# Patient Record
Sex: Male | Born: 1960 | Race: White | Hispanic: No | Marital: Single | Smoking: Current every day smoker
Health system: Southern US, Community
[De-identification: ages and names within clinical notes are randomized; demographics above are authoritative.]

---

## 1997-09-07 ENCOUNTER — Emergency Department (HOSPITAL_COMMUNITY): Admission: EM | Admit: 1997-09-07 | Discharge: 1997-09-08 | Payer: Self-pay

## 1998-03-26 ENCOUNTER — Encounter: Payer: Self-pay | Admitting: Emergency Medicine

## 1998-03-26 ENCOUNTER — Emergency Department (HOSPITAL_COMMUNITY): Admission: EM | Admit: 1998-03-26 | Discharge: 1998-03-26 | Payer: Self-pay | Admitting: Emergency Medicine

## 1999-02-28 ENCOUNTER — Emergency Department (HOSPITAL_COMMUNITY): Admission: EM | Admit: 1999-02-28 | Discharge: 1999-02-28 | Payer: Self-pay | Admitting: Internal Medicine

## 1999-06-25 ENCOUNTER — Emergency Department (HOSPITAL_COMMUNITY): Admission: EM | Admit: 1999-06-25 | Discharge: 1999-06-25 | Payer: Self-pay | Admitting: Emergency Medicine

## 2000-01-03 ENCOUNTER — Emergency Department (HOSPITAL_COMMUNITY): Admission: EM | Admit: 2000-01-03 | Discharge: 2000-01-03 | Payer: Self-pay | Admitting: Emergency Medicine

## 2000-03-03 ENCOUNTER — Emergency Department (HOSPITAL_COMMUNITY): Admission: EM | Admit: 2000-03-03 | Discharge: 2000-03-03 | Payer: Self-pay | Admitting: Emergency Medicine

## 2000-03-08 ENCOUNTER — Emergency Department (HOSPITAL_COMMUNITY): Admission: EM | Admit: 2000-03-08 | Discharge: 2000-03-08 | Payer: Self-pay | Admitting: Emergency Medicine

## 2000-03-14 ENCOUNTER — Emergency Department (HOSPITAL_COMMUNITY): Admission: EM | Admit: 2000-03-14 | Discharge: 2000-03-15 | Payer: Self-pay | Admitting: Emergency Medicine

## 2000-07-11 ENCOUNTER — Encounter: Payer: Self-pay | Admitting: Emergency Medicine

## 2000-07-11 ENCOUNTER — Emergency Department (HOSPITAL_COMMUNITY): Admission: EM | Admit: 2000-07-11 | Discharge: 2000-07-11 | Payer: Self-pay | Admitting: Emergency Medicine

## 2000-07-12 ENCOUNTER — Emergency Department (HOSPITAL_COMMUNITY): Admission: EM | Admit: 2000-07-12 | Discharge: 2000-07-12 | Payer: Self-pay | Admitting: Emergency Medicine

## 2002-12-05 ENCOUNTER — Emergency Department (HOSPITAL_COMMUNITY): Admission: EM | Admit: 2002-12-05 | Discharge: 2002-12-05 | Payer: Self-pay | Admitting: Emergency Medicine

## 2003-01-19 ENCOUNTER — Emergency Department (HOSPITAL_COMMUNITY): Admission: EM | Admit: 2003-01-19 | Discharge: 2003-01-19 | Payer: Self-pay | Admitting: Emergency Medicine

## 2004-02-03 ENCOUNTER — Emergency Department (HOSPITAL_COMMUNITY): Admission: EM | Admit: 2004-02-03 | Discharge: 2004-02-03 | Payer: Self-pay | Admitting: Emergency Medicine

## 2004-05-15 ENCOUNTER — Emergency Department (HOSPITAL_COMMUNITY): Admission: EM | Admit: 2004-05-15 | Discharge: 2004-05-16 | Payer: Self-pay | Admitting: Emergency Medicine

## 2010-08-08 ENCOUNTER — Other Ambulatory Visit: Payer: Self-pay | Admitting: Family Medicine

## 2010-08-08 DIAGNOSIS — R110 Nausea: Secondary | ICD-10-CM

## 2010-08-09 ENCOUNTER — Ambulatory Visit
Admission: RE | Admit: 2010-08-09 | Discharge: 2010-08-09 | Disposition: A | Discharge status: Home / Self Care | Payer: No Typology Code available for payment source | Source: Ambulatory Visit | Attending: Family Medicine | Admitting: Family Medicine

## 2010-08-09 DIAGNOSIS — R110 Nausea: Secondary | ICD-10-CM

## 2010-08-15 ENCOUNTER — Encounter: Payer: Self-pay | Admitting: Internal Medicine

## 2010-09-06 ENCOUNTER — Ambulatory Visit: Payer: No Typology Code available for payment source | Admitting: Internal Medicine

## 2014-08-03 ENCOUNTER — Ambulatory Visit (INDEPENDENT_AMBULATORY_CARE_PROVIDER_SITE_OTHER): Payer: 59

## 2014-08-03 ENCOUNTER — Ambulatory Visit (INDEPENDENT_AMBULATORY_CARE_PROVIDER_SITE_OTHER): Payer: 59 | Admitting: Family Medicine

## 2014-08-03 VITALS — BP 140/90 | HR 51 | Temp 98.2°F | Resp 18 | Ht 67.5 in | Wt 164.2 lb

## 2014-08-03 DIAGNOSIS — F17200 Nicotine dependence, unspecified, uncomplicated: Secondary | ICD-10-CM

## 2014-08-03 DIAGNOSIS — M5442 Lumbago with sciatica, left side: Secondary | ICD-10-CM

## 2014-08-03 DIAGNOSIS — R062 Wheezing: Secondary | ICD-10-CM | POA: Diagnosis not present

## 2014-08-03 DIAGNOSIS — J209 Acute bronchitis, unspecified: Secondary | ICD-10-CM

## 2014-08-03 MED ORDER — ALBUTEROL SULFATE HFA 108 (90 BASE) MCG/ACT IN AERS: 2.0000 | INHALATION_SPRAY | Freq: Four times a day (QID) | RESPIRATORY_TRACT | Status: DC | PRN

## 2014-08-03 MED ORDER — IPRATROPIUM BROMIDE 0.02 % IN SOLN
0.5000 mg | Freq: Once | RESPIRATORY_TRACT | Status: AC
  Administered 2014-08-03: 0.5 mg via RESPIRATORY_TRACT

## 2014-08-03 MED ORDER — TRAMADOL HCL 50 MG PO TABS: 50.0000 mg | ORAL_TABLET | Freq: Three times a day (TID) | ORAL | Status: AC | PRN

## 2014-08-03 MED ORDER — ALBUTEROL SULFATE (2.5 MG/3ML) 0.083% IN NEBU
2.5000 mg | INHALATION_SOLUTION | Freq: Once | RESPIRATORY_TRACT | Status: AC
  Administered 2014-08-03: 2.5 mg via RESPIRATORY_TRACT

## 2014-08-03 MED ORDER — CYCLOBENZAPRINE HCL 5 MG PO TABS: 5.0000 mg | ORAL_TABLET | Freq: Every evening | ORAL | Status: AC | PRN

## 2014-08-03 MED ORDER — NAPROXEN 500 MG PO TABS: 500.0000 mg | ORAL_TABLET | Freq: Two times a day (BID) | ORAL | Status: AC

## 2014-08-03 MED ORDER — METHYLPREDNISOLONE 4 MG PO TBPK: ORAL_TABLET | ORAL | Status: DC

## 2014-08-03 MED ORDER — AZITHROMYCIN 250 MG PO TABS: ORAL_TABLET | ORAL | Status: DC

## 2014-08-03 NOTE — Progress Notes (Signed)
 Chief Complaint:  Chief Complaint  Patient presents with  . Back Pain    lower back, x 1 week  . Insect Bite    both legs x 1 week unsure of what bit him    HPI: Dakota Steele is a 54 y.o. male who [facility] today complaining of one-week history of low back pain on the left side status post helping a friend move. He is visiting from [place]. He has some left tingling. It is mostly on the front of his leg. It does not radiate down to his passes knee. He has no incontinence. He has a prior history of low back pain and had seen a neurosurgeon in [place]. They were asked him if he wanted to surgery but he said no because there was a small chance that he would not do well. He denies any diabetes. He is a smoker and has been wheezing off and on when he lays down. No weight gain no lower extremity edema. No palpitations or chest pain. The 25 year one pack per day smoker. He is not interested in quitting. Coughing up white sputum. He has had some intermittent subjective fevers.  History reviewed. No pertinent past medical history. History reviewed. No pertinent past surgical history. History   Social History  . Marital Status: Single    Spouse Name: N/A  . Number of Children: N/A  . Years of Education: N/A   Social History Main Topics  . Smoking status: Current Every Day Smoker  . Smokeless tobacco: Not on file  . Alcohol Use: No  . Drug Use: Not on file  . Sexual Activity: Not on file   Other Topics Concern  . None   Social History Narrative  . None   Family History  Problem Relation Age of Onset  . Hypertension Mother    No Known Allergies Prior to Admission medications   Not on File     ROS: The patient denies fevers, chills, night sweats, unintentional weight loss, chest pain, palpitations, wheezing, dyspnea on exertion, nausea, vomiting, abdominal pain, dysuria, hematuria, melena, numbness, weakness, or tingling.   All other systems have been reviewed  and were otherwise negative with the exception of those mentioned in the HPI and as above.    PHYSICAL EXAM: Filed Vitals:   08/03/14 1215  BP: 140/90  Pulse: 51  Temp: 98.2 F (36.8 C)  Resp: 18   SpO2 Readings from Last 3 Encounters:  08/03/14 98%     General: Alert, no acute distress HEENT:  Normocephalic, atraumatic, oropharynx patent. TM nl Eye: EOMI, PERRLA Cardiovascular:  Regular rate and rhythm, no rubs murmurs or gallops.  No Carotid bruits, radial pulse intact. No pedal edema.  Respiratory: + wheezes, No rales, or rhonchi.  No cyanosis, no use of accessory musculature Abdominal: No organomegaly, abdomen is soft and non-tender, positive bowel sounds.  No masses. Musculoskeletal: Gait intact. No edema, tenderness Skin: + bug bites no cellulitis Neurologic: Facial musculature symmetric. Psychiatric: Patient acts appropriately throughout our interaction. Lymphatic: No cervical or submandibular lymphadenopathy + paramsk tenderness  Full ROM 5/5 strength, 2/2 DTRs No saddle anesthesia Straight leg negative Hip and knee exam--normal       LABS: No results found for this or any previous visit.   EKG/XRAY:   Primary read interpreted [facility] Chest xray -? Left mid lobe perihilar infiltrate vs increase vascular markings L-spine-no acute fx or dislocation, + DJD , spondylisthesis Vessel  calcification   ASSESSMENT/PLAN: DJD with sciatica Acute lower respiratory infection most likely bronchitis Rx z pak, prednisone, albuterol; flexeril, tramadol, naprosyn once done with prednisone Pre and post peak were slightly improved after neb treatment x 1, he declines to stop smoking Needs fu with PCP in [place]      Gross sideeffects, risk and benefits, and alternatives of medications d/w patient. Patient is aware that all medications have potential sideeffects and we are unable to predict every sideeffect or drug-drug interaction that may occur.     DO 08/03/2014 1:24 PM

## 2014-08-03 NOTE — Patient Instructions (Signed)
Sciatica with Rehab The sciatic nerve runs from the back down the leg and is responsible for sensation and control of the muscles in the back (posterior) side of the thigh, lower leg, and foot. Sciatica is a condition that is characterized by inflammation of this nerve.  SYMPTOMS   Signs of nerve damage, including numbness and/or weakness along the posterior side of the lower extremity.  Pain in the back of the thigh that may also travel down the leg.  Pain that worsens when sitting for long periods of time.  Occasionally, pain in the back or buttock. CAUSES  Inflammation of the sciatic nerve is the cause of sciatica. The inflammation is due to something irritating the nerve. Common sources of irritation include:  Sitting for long periods of time.  Direct trauma to the nerve.  Arthritis of the spine.  Herniated or ruptured disk.  Slipping of the vertebrae (spondylolisthesis).  Pressure from soft tissues, such as muscles or ligament-like tissue (fascia). RISK INCREASES WITH:  Sports that place pressure or stress on the spine (football or weightlifting).  Poor strength and flexibility.  Failure to warm up properly before activity.  Family history of low back pain or disk disorders.  Previous back injury or surgery.  Poor body mechanics, especially when lifting, or poor posture. PREVENTION   Warm up and stretch properly before activity.  Maintain physical fitness:  Strength, flexibility, and endurance.  Cardiovascular fitness.  Learn and use proper technique, especially with posture and lifting. When possible, have coach correct improper technique.  Avoid activities that place stress on the spine. PROGNOSIS If treated properly, then sciatica usually resolves within 6 weeks. However, occasionally surgery is necessary.  RELATED COMPLICATIONS   Permanent nerve damage, including pain, numbness, tingle, or weakness.  Chronic back pain.  Risks of surgery: infection,  bleeding, nerve damage, or damage to surrounding tissues. TREATMENT Treatment initially involves resting from any activities that aggravate your symptoms. The use of ice and medication may help reduce pain and inflammation. The use of strengthening and stretching exercises may help reduce pain with activity. These exercises may be performed at home or with referral to a therapist. A therapist may recommend further treatments, such as transcutaneous electronic nerve stimulation (TENS) or ultrasound. Your caregiver may recommend corticosteroid injections to help reduce inflammation of the sciatic nerve. If symptoms persist despite non-surgical (conservative) treatment, then surgery may be recommended. MEDICATION  If pain medication is necessary, then nonsteroidal anti-inflammatory medications, such as aspirin and ibuprofen, or other minor pain relievers, such as acetaminophen, are often recommended.  Do not take pain medication for 7 days before surgery.  Prescription pain relievers may be given if deemed necessary by your caregiver. Use only as directed and only as much as you need.  Ointments applied to the skin may be helpful.  Corticosteroid injections may be given by your caregiver. These injections should be reserved for the most serious cases, because they may only be given a certain number of times. HEAT AND COLD  Cold treatment (icing) relieves pain and reduces inflammation. Cold treatment should be applied for 10 to 15 minutes every 2 to 3 hours for inflammation and pain and immediately after any activity that aggravates your symptoms. Use ice packs or massage the area with a piece of ice (ice massage).  Heat treatment may be used prior to performing the stretching and strengthening activities prescribed by your caregiver, physical therapist, or athletic trainer. Use a heat pack or soak the injury in warm water.   SEEK MEDICAL CARE IF:  Treatment seems to offer no benefit, or the condition  worsens.  Any medications produce adverse side effects. EXERCISES  RANGE OF MOTION (ROM) AND STRETCHING EXERCISES - Sciatica Most people with sciatic will find that their symptoms worsen with either excessive bending forward (flexion) or arching at the low back (extension). The exercises which will help resolve your symptoms will focus on the opposite motion. Your physician, physical therapist or athletic trainer will help you determine which exercises will be most helpful to resolve your low back pain. Do not complete any exercises without first consulting with your clinician. Discontinue any exercises which worsen your symptoms until you speak to your clinician. If you have pain, numbness or tingling which travels down into your buttocks, leg or foot, the goal of the therapy is for these symptoms to move closer to your back and eventually resolve. Occasionally, these leg symptoms will get better, but your low back pain may worsen; this is typically an indication of progress in your rehabilitation. Be certain to be very alert to any changes in your symptoms and the activities in which you participated in the 24 hours prior to the change. Sharing this information with your clinician will allow him/her to most efficiently treat your condition. These exercises may help you when beginning to rehabilitate your injury. Your symptoms may resolve with or without further involvement from your physician, physical therapist or athletic trainer. While completing these exercises, remember:   Restoring tissue flexibility helps normal motion to return to the joints. This allows healthier, less painful movement and activity.  An effective stretch should be held for at least 30 seconds.  A stretch should never be painful. You should only feel a gentle lengthening or release in the stretched tissue. FLEXION RANGE OF MOTION AND STRETCHING EXERCISES: STRETCH - Flexion, Single Knee to Chest   Lie on a firm bed or floor  with both legs extended in front of you.  Keeping one leg in contact with the floor, bring your opposite knee to your chest. Hold your leg in place by either grabbing behind your thigh or at your knee.  Pull until you feel a gentle stretch in your low back. Hold __________ seconds.  Slowly release your grasp and repeat the exercise with the opposite side. Repeat __________ times. Complete this exercise __________ times per day.  STRETCH - Flexion, Double Knee to Chest  Lie on a firm bed or floor with both legs extended in front of you.  Keeping one leg in contact with the floor, bring your opposite knee to your chest.  Tense your stomach muscles to support your back and then lift your other knee to your chest. Hold your legs in place by either grabbing behind your thighs or at your knees.  Pull both knees toward your chest until you feel a gentle stretch in your low back. Hold __________ seconds.  Tense your stomach muscles and slowly return one leg at a time to the floor. Repeat __________ times. Complete this exercise __________ times per day.  STRETCH - Low Trunk Rotation   Lie on a firm bed or floor. Keeping your legs in front of you, bend your knees so they are both pointed toward the ceiling and your feet are flat on the floor.  Extend your arms out to the side. This will stabilize your upper body by keeping your shoulders in contact with the floor.  Gently and slowly drop both knees together to one side until   you feel a gentle stretch in your low back. Hold for __________ seconds.  Tense your stomach muscles to support your low back as you bring your knees back to the starting position. Repeat the exercise to the other side. Repeat __________ times. Complete this exercise __________ times per day  EXTENSION RANGE OF MOTION AND FLEXIBILITY EXERCISES: STRETCH - Extension, Prone on Elbows  Lie on your stomach on the floor, a bed will be too soft. Place your palms about shoulder  width apart and at the height of your head.  Place your elbows under your shoulders. If this is too painful, stack pillows under your chest.  Allow your body to relax so that your hips drop lower and make contact more completely with the floor.  Hold this position for __________ seconds.  Slowly return to lying flat on the floor. Repeat __________ times. Complete this exercise __________ times per day.  RANGE OF MOTION - Extension, Prone Press Ups  Lie on your stomach on the floor, a bed will be too soft. Place your palms about shoulder width apart and at the height of your head.  Keeping your back as relaxed as possible, slowly straighten your elbows while keeping your hips on the floor. You may adjust the placement of your hands to maximize your comfort. As you gain motion, your hands will come more underneath your shoulders.  Hold this position __________ seconds.  Slowly return to lying flat on the floor. Repeat __________ times. Complete this exercise __________ times per day.  STRENGTHENING EXERCISES - Sciatica  These exercises may help you when beginning to rehabilitate your injury. These exercises should be done near your "sweet spot." This is the neutral, low-back arch, somewhere between fully rounded and fully arched, that is your least painful position. When performed in this safe range of motion, these exercises can be used for people who have either a flexion or extension based injury. These exercises may resolve your symptoms with or without further involvement from your physician, physical therapist or athletic trainer. While completing these exercises, remember:   Muscles can gain both the endurance and the strength needed for everyday activities through controlled exercises.  Complete these exercises as instructed by your physician, physical therapist or athletic trainer. Progress with the resistance and repetition exercises only as your caregiver advises.  You may  experience muscle soreness or fatigue, but the pain or discomfort you are trying to eliminate should never worsen during these exercises. If this pain does worsen, stop and make certain you are following the directions exactly. If the pain is still present after adjustments, discontinue the exercise until you can discuss the trouble with your clinician. STRENGTHENING - Deep Abdominals, Pelvic Tilt   Lie on a firm bed or floor. Keeping your legs in front of you, bend your knees so they are both pointed toward the ceiling and your feet are flat on the floor.  Tense your lower abdominal muscles to press your low back into the floor. This motion will rotate your pelvis so that your tail bone is scooping upwards rather than pointing at your feet or into the floor.  With a gentle tension and even breathing, hold this position for __________ seconds. Repeat __________ times. Complete this exercise __________ times per day.  STRENGTHENING - Abdominals, Crunches   Lie on a firm bed or floor. Keeping your legs in front of you, bend your knees so they are both pointed toward the ceiling and your feet are flat on the   floor. Cross your arms over your chest.  Slightly tip your chin down without bending your neck.  Tense your abdominals and slowly lift your trunk high enough to just clear your shoulder blades. Lifting higher can put excessive stress on the low back and does not further strengthen your abdominal muscles.  Control your return to the starting position. Repeat __________ times. Complete this exercise __________ times per day.  STRENGTHENING - Quadruped, Opposite UE/Raylyn Carton Lift  Assume a hands and knees position on a firm surface. Keep your hands under your shoulders and your knees under your hips. You may place padding under your knees for comfort.  Find your neutral spine and gently tense your abdominal muscles so that you can maintain this position. Your shoulders and hips should form a rectangle  that is parallel with the floor and is not twisted.  Keeping your trunk steady, lift your right hand no higher than your shoulder and then your left leg no higher than your hip. Make sure you are not holding your breath. Hold this position __________ seconds.  Continuing to keep your abdominal muscles tense and your back steady, slowly return to your starting position. Repeat with the opposite arm and leg. Repeat __________ times. Complete this exercise __________ times per day.  STRENGTHENING - Abdominals and Quadriceps, Straight Leg Raise   Lie on a firm bed or floor with both legs extended in front of you.  Keeping one leg in contact with the floor, bend the other knee so that your foot can rest flat on the floor.  Find your neutral spine, and tense your abdominal muscles to maintain your spinal position throughout the exercise.  Slowly lift your straight leg off the floor about 6 inches for a count of 15, making sure to not hold your breath.  Still keeping your neutral spine, slowly lower your leg all the way to the floor. Repeat this exercise with each leg __________ times. Complete this exercise __________ times per day. POSTURE AND BODY MECHANICS CONSIDERATIONS - Sciatica Keeping correct posture when sitting, standing or completing your activities will reduce the stress put on different body tissues, allowing injured tissues a chance to heal and limiting painful experiences. The following are general guidelines for improved posture. Your physician or physical therapist will provide you with any instructions specific to your needs. While reading these guidelines, remember:  The exercises prescribed by your provider will help you have the flexibility and strength to maintain correct postures.  The correct posture provides the optimal environment for your joints to work. All of your joints have less wear and tear when properly supported by a spine with good posture. This means you will  experience a healthier, less painful body.  Correct posture must be practiced with all of your activities, especially prolonged sitting and standing. Correct posture is as important when doing repetitive low-stress activities (typing) as it is when doing a single heavy-load activity (lifting). RESTING POSITIONS Consider which positions are most painful for you when choosing a resting position. If you have pain with flexion-based activities (sitting, bending, stooping, squatting), choose a position that allows you to rest in a less flexed posture. You would want to avoid curling into a fetal position on your side. If your pain worsens with extension-based activities (prolonged standing, working overhead), avoid resting in an extended position such as sleeping on your stomach. Most people will find more comfort when they rest with their spine in a more neutral position, neither too rounded nor too   arched. Lying on a non-sagging bed on your side with a pillow between your knees, or on your back with a pillow under your knees will often provide some relief. Keep in mind, being in any one position for a prolonged period of time, no matter how correct your posture, can still lead to stiffness. PROPER SITTING POSTURE In order to minimize stress and discomfort on your spine, you must sit with correct posture Sitting with good posture should be effortless for a healthy body. Returning to good posture is a gradual process. Many people can work toward this most comfortably by using various supports until they have the flexibility and strength to maintain this posture on their own. When sitting with proper posture, your ears will fall over your shoulders and your shoulders will fall over your hips. You should use the back of the chair to support your upper back. Your low back will be in a neutral position, just slightly arched. You may place a small pillow or folded towel at the base of your low back for support.  When  working at a desk, create an environment that supports good, upright posture. Without extra support, muscles fatigue and lead to excessive strain on joints and other tissues. Keep these recommendations in mind: CHAIR:   A chair should be able to slide under your desk when your back makes contact with the back of the chair. This allows you to work closely.  The chair's height should allow your eyes to be level with the upper part of your monitor and your hands to be slightly lower than your elbows. BODY POSITION  Your feet should make contact with the floor. If this is not possible, use a foot rest.  Keep your ears over your shoulders. This will reduce stress on your neck and low back. INCORRECT SITTING POSTURES   If you are feeling tired and unable to assume a healthy sitting posture, do not slouch or slump. This puts excessive strain on your back tissues, causing more damage and pain. Healthier options include:  Using more support, like a lumbar pillow.  Switching tasks to something that requires you to be upright or walking.  Talking a brief walk.  Lying down to rest in a neutral-spine position. PROLONGED STANDING WHILE SLIGHTLY LEANING FORWARD  When completing a task that requires you to lean forward while standing in one place for a long time, place either foot up on a stationary 2-4 inch high object to help maintain the best posture. When both feet are on the ground, the low back tends to lose its slight inward curve. If this curve flattens (or becomes too large), then the back and your other joints will experience too much stress, fatigue more quickly and can cause pain.  CORRECT STANDING POSTURES Proper standing posture should be assumed with all daily activities, even if they only take a few moments, like when brushing your teeth. As in sitting, your ears should fall over your shoulders and your shoulders should fall over your hips. You should keep a slight tension in your abdominal  muscles to brace your spine. Your tailbone should point down to the ground, not behind your body, resulting in an over-extended swayback posture.  INCORRECT STANDING POSTURES  Common incorrect standing postures include a forward head, locked knees and/or an excessive swayback. WALKING Walk with an upright posture. Your ears, shoulders and hips should all line-up. PROLONGED ACTIVITY IN A FLEXED POSITION When completing a task that requires you to bend forward   at your waist or lean over a low surface, try to find a way to stabilize 3 of 4 of your limbs. You can place a hand or elbow on your thigh or rest a knee on the surface you are reaching across. This will provide you more stability so that your muscles do not fatigue as quickly. By keeping your knees relaxed, or slightly bent, you will also reduce stress across your low back. CORRECT LIFTING TECHNIQUES DO :   Assume a wide stance. This will provide you more stability and the opportunity to get as close as possible to the object which you are lifting.  Tense your abdominals to brace your spine; then bend at the knees and hips. Keeping your back locked in a neutral-spine position, lift using your leg muscles. Lift with your legs, keeping your back straight.  Test the weight of unknown objects before attempting to lift them.  Try to keep your elbows locked down at your sides in order get the best strength from your shoulders when carrying an object.  Always ask for help when lifting heavy or awkward objects. INCORRECT LIFTING TECHNIQUES DO NOT:   Lock your knees when lifting, even if it is a small object.  Bend and twist. Pivot at your feet or move your feet when needing to change directions.  Assume that you cannot safely pick up a paperclip without proper posture. Document Released: 01/09/2005 Document Revised: 05/26/2013 Document Reviewed: 04/23/2008 [facility] Patient Information 2015 [place], [place]. This information is not intended to  replace advice given to you by your health care provider. Make sure you discuss any questions you have with your health care provider. Acute Bronchitis Bronchitis is inflammation of the airways that extend from the windpipe into the lungs (bronchi). The inflammation often causes mucus to develop. This leads to a cough, which is the most common symptom of bronchitis.  In acute bronchitis, the condition usually develops suddenly and goes away over time, usually in a couple weeks. Smoking, allergies, and asthma can make bronchitis worse. Repeated episodes of bronchitis may cause further lung problems.  CAUSES Acute bronchitis is most often caused by the same virus that causes a cold. The virus can spread from person to person (contagious) through coughing, sneezing, and touching contaminated objects. SIGNS AND SYMPTOMS   Cough.   Fever.   Coughing up mucus.   Body aches.   Chest congestion.   Chills.   Shortness of breath.   Sore throat.  DIAGNOSIS  Acute bronchitis is usually diagnosed through a physical exam. Your health care provider will also ask you questions about your medical history. Tests, such as chest X-rays, are sometimes done to rule out other conditions.  TREATMENT  Acute bronchitis usually goes away in a couple weeks. Oftentimes, no medical treatment is necessary. Medicines are sometimes given for relief of fever or cough. Antibiotic medicines are usually not needed but may be prescribed in certain situations. In some cases, an inhaler may be recommended to help reduce shortness of breath and control the cough. A cool mist vaporizer may also be used to help thin bronchial secretions and make it easier to clear the chest.  HOME CARE INSTRUCTIONS  Get plenty of rest.   Drink enough fluids to keep your urine clear or pale yellow (unless you have a medical condition that requires fluid restriction). Increasing fluids may help thin your respiratory secretions (sputum)  and reduce chest congestion, and it will prevent dehydration.   Take medicines only as directed  by your health care provider.  If you were prescribed an antibiotic medicine, finish it all even if you start to feel better.  Avoid smoking and secondhand smoke. Exposure to cigarette smoke or irritating chemicals will make bronchitis worse. If you are a smoker, consider using nicotine gum or skin patches to help control withdrawal symptoms. Quitting smoking will help your lungs heal faster.   Reduce the chances of another bout of acute bronchitis by washing your hands frequently, avoiding people with cold symptoms, and trying not to touch your hands to your mouth, nose, or eyes.   Keep all follow-up visits as directed by your health care provider.  SEEK MEDICAL CARE IF: Your symptoms do not improve after 1 week of treatment.  SEEK IMMEDIATE MEDICAL CARE IF:  You develop an increased fever or chills.   You have chest pain.   You have severe shortness of breath.  You have bloody sputum.   You develop dehydration.  You faint or repeatedly feel like you are going to pass out.  You develop repeated vomiting.  You develop a severe headache. MAKE SURE YOU:   Understand these instructions.  Will watch your condition.  Will get help right away if you are not doing well or get worse. Document Released: 02/17/2004 Document Revised: 05/26/2013 Document Reviewed: 07/02/2012 [facility] Patient Information 2015 [place], [place]. This information is not intended to replace advice given to you by your health care provider. Make sure you discuss any questions you have with your health care provider.

## 2015-02-11 ENCOUNTER — Emergency Department (HOSPITAL_COMMUNITY)
Admission: EM | Admit: 2015-02-11 | Discharge: 2015-02-11 | Disposition: A | Discharge status: Home / Self Care | Payer: Medicaid - Out of State | Attending: Emergency Medicine | Admitting: Emergency Medicine

## 2015-02-11 ENCOUNTER — Encounter (HOSPITAL_COMMUNITY): Payer: Self-pay | Admitting: *Deleted

## 2015-02-11 DIAGNOSIS — F17200 Nicotine dependence, unspecified, uncomplicated: Secondary | ICD-10-CM | POA: Insufficient documentation

## 2015-02-11 DIAGNOSIS — F1010 Alcohol abuse, uncomplicated: Secondary | ICD-10-CM | POA: Diagnosis present

## 2015-02-11 DIAGNOSIS — Z79899 Other long term (current) drug therapy: Secondary | ICD-10-CM | POA: Diagnosis not present

## 2015-02-11 MED ORDER — FOLIC ACID 1 MG PO TABS: 1.0000 mg | ORAL_TABLET | Freq: Every day | ORAL | Status: AC

## 2015-02-11 MED ORDER — CHLORDIAZEPOXIDE HCL 25 MG PO CAPS: ORAL_CAPSULE | ORAL | Status: DC

## 2015-02-11 MED ORDER — MULTI-VITAMIN/MINERALS PO TABS: 1.0000 | ORAL_TABLET | Freq: Every day | ORAL | Status: AC

## 2015-02-11 NOTE — Discharge Instructions (Signed)
Use the list below to find a detox facility. STOP DRINKING ALCOHOL. Take the medications to help with withdrawal symptoms, but DO NOT COMBINE THEM WITH ALCOHOL. Follow up with your regular doctor in 1 week for recheck of ongoing symptoms. Return to the ER for emergent changes or worsening symptoms.  Alcohol Abuse and Nutrition Alcohol abuse is any pattern of alcohol consumption that harms your health, relationships, or work. Alcohol abuse can affect how your body breaks down and absorbs nutrients from food by causing your liver to work abnormally. Additionally, many people who abuse alcohol do not eat enough carbohydrates, protein, fat, vitamins, and minerals. This can cause poor nutrition (malnutrition) and a lack of nutrients (nutrient deficiencies), which can lead to further complications. Nutrients that are commonly lacking (deficient) among people who abuse alcohol include:  Vitamins.  Vitamin A. This is stored in your liver. It is important for your vision, metabolism, and ability to fight off infections (immunity).  B vitamins. These include vitamins such as folate, thiamin, and niacin. These are important in new cell growth and maintenance.  Vitamin C. This plays an important role in iron absorption, wound healing, and immunity.  Vitamin D. This is produced by your liver, but you can also get vitamin D from food. Vitamin D is necessary for your body to absorb and use calcium.  Minerals.  Calcium. This is important for your bones and your heart and blood vessel (cardiovascular) function.  Iron. This is important for blood, muscle, and nervous system functioning.  Magnesium. This plays an important role in muscle and nerve function, and it helps to control blood sugar and blood pressure.  Zinc. This is important for the normal function of your nervous system and digestive system (gastrointestinal tract). Nutrition is an essential component of therapy for alcohol abuse. Your health care  provider or dietitian will work with you to design a plan that can help restore nutrients to your body and prevent potential complications. WHAT IS MY PLAN? Your dietitian may develop a specific diet plan that is based on your condition and any other complications you may have. A diet plan will commonly include:  A balanced diet.  Grains: 6-8 oz per day.  Vegetables: 2-3 cups per day.  Fruits: 1-2 cups per day.  Meat and other protein: 5-6 oz per day.  Dairy: 2-3 cups per day.  Vitamin and mineral supplements. WHAT DO I NEED TO KNOW ABOUT ALCOHOL AND NUTRITION?  Consume foods that are high in antioxidants, such as grapes, berries, nuts, green tea, and dark green and orange vegetables. This can help to counteract some of the stress that is placed on your liver by consuming alcohol.  Avoid food and drinks that are high in fat and sugar. Foods such as sugared soft drinks, salty snack foods, and candy contain empty calories. This means that they lack important nutrients such as protein, fiber, and vitamins.  Eat frequent meals and snacks. Try to eat 5-6 small meals each day.  Eat a variety of fresh fruits and vegetables each day. This will help you get plenty of water, fiber, and vitamins in your diet.  Drink plenty of water and other clear fluids. Try to drink at least 48-64 oz (1.5-2 L) of water per day.  If you are a vegetarian, eat a variety of protein-rich foods. Pair whole grains with plant-based proteins at meals and snacks to obtain the greatest nutrient benefit from your food. For example, eat rice with beans, put peanut butter on  whole-grain toast, or eat oatmeal with sunflower seeds.  Soak beans and whole grains overnight before cooking. This can help your body to absorb the nutrients more easily.  Include foods fortified with vitamins and minerals in your diet. Commonly fortified foods include milk, orange juice, cereal, and bread.  If you are malnourished, your dietitian  may recommend a high-protein, high-calorie diet. This may include:  2,000-3,000 calories (kilocalories) per day.  70-100 grams of protein per day.  Your health care provider may recommend a complete nutritional supplement beverage. This can help to restore calories, protein, and vitamins to your body. Depending on your condition, you may be advised to consume this instead of or in addition to meals.  Limit your intake of caffeine. Replace drinks like coffee and black tea with decaffeinated coffee and herbal tea.  Eat a variety of foods that are high in omega fatty acids. These include fish, nuts and seeds, and soybeans. These foods may help your liver to recover and may also stabilize your mood.  Certain medicines may cause changes in your appetite, taste, and weight. Work with your health care provider and dietitian to make any adjustments to your medicines and diet plan.  Include other healthy lifestyle choices in your daily routine.  Be physically active.  Get enough sleep.  Spend time doing activities that you enjoy.  If you are unable to take in enough food and calories by mouth, your health care provider may recommend a feeding tube. This is a tube that passes through your nose and throat, directly into your stomach. Nutritional supplement beverages can be given to you through the feeding tube to help you get the nutrients you need.  Take vitamin or mineral supplements as recommended by your health care provider. WHAT FOODS CAN I EAT? Grains Enriched pasta. Enriched rice. Fortified whole-grain bread. Fortified whole-grain cereal. Barley. Brown rice. Quinoa. Millet. Vegetables All fresh, frozen, and canned vegetables. Spinach. Kale. Artichoke. Carrots. Winter squash and pumpkin. Sweet potatoes. Broccoli. Cabbage. Cucumbers. Tomatoes. Sweet peppers. Green beans. Peas. Corn. Fruits All fresh and frozen fruits. Berries. Grapes. Mango. Papaya. Guava. Cherries. Apples. Bananas.  Peaches. Plums. Pineapple. Watermelon. Cantaloupe. Oranges. Avocado. Meats and Other Protein Sources Beef liver. Lean beef. Pork. Fresh and canned chicken. Fresh fish. Oysters. Sardines. Canned tuna. Shrimp. Eggs with yolks. Nuts and seeds. Peanut butter. Beans and lentils. Soybeans. Tofu. Dairy Whole, low-fat, and nonfat milk. Whole, low-fat, and nonfat yogurt. Cottage cheese. Sour cream. Hard and soft cheeses. Beverages Water. Herbal tea. Decaffeinated coffee. Decaffeinated green tea. 100% fruit juice. 100% vegetable juice. Instant breakfast shakes. Condiments Ketchup. Mayonnaise. Mustard. Salad dressing. Barbecue sauce. Sweets and Desserts Sugar-free ice cream. Sugar-free pudding. Sugar-free gelatin. Fats and Oils Butter. Vegetable oil, flaxseed oil, olive oil, and walnut oil. Other Complete nutrition shakes. Protein bars. Sugar-free gum. The items listed above may not be a complete list of recommended foods or beverages. Contact your dietitian for more options. WHAT FOODS ARE NOT RECOMMENDED? Grains Sugar-sweetened breakfast cereals. Flavored instant oatmeal. Fried breads. Vegetables Breaded or deep-fried vegetables. Fruits Dried fruit with added sugar. Candied fruit. Canned fruit in syrup. Meats and Other Protein Sources Breaded or deep-fried meats. Dairy Flavored milks. Fried cheese curds or fried cheese sticks. Beverages Alcohol. Sugar-sweetened soft drinks. Sugar-sweetened tea. Caffeinated coffee and tea. Condiments Sugar. Honey. Agave nectar. Molasses. Sweets and Desserts Chocolate. Cake. Cookies. Candy. Other Potato chips. Pretzels. Salted nuts. Candied nuts. The items listed above may not be a complete list of foods and beverages to  avoid. Contact your dietitian for more information.   This information is not intended to replace advice given to you by your health care provider. Make sure you discuss any questions you have with your health care provider.   Document  Released: 11/03/2004 Document Revised: 01/30/2014 Document Reviewed: 08/12/2013 Elsevier Interactive Patient Education 2016 [employer].  Emergency Department Resource Guide 1) Find a Doctor and Pay Out of Pocket Although you won't have to find out who is covered by your insurance plan, it is a good idea to ask around and get recommendations. You will then need to call the office and see if the doctor you have chosen will accept you as a new patient and what types of options they offer for patients who are self-pay. Some doctors offer discounts or will set up payment plans for their patients who do not have insurance, but you will need to ask so you aren't surprised when you get to your appointment.  2) Contact Your Local Health Department Not all health departments have doctors that can see patients for sick visits, but many do, so it is worth a call to see if yours does. If you don't know where your local health department is, you can check in your phone book. The CDC also has a tool to help you locate your state's health department, and many state websites also have listings of all of their local health departments.  3) Find a Walk-in Clinic If your illness is not likely to be very severe or complicated, you may want to try a walk in clinic. These are popping up all over the country in pharmacies, drugstores, and shopping centers. They're usually staffed by nurse practitioners or physician assistants that have been trained to treat common illnesses and complaints. They're usually fairly quick and inexpensive. However, if you have serious medical issues or chronic medical problems, these are probably not your best option.  No Primary Care Doctor: - Call Health Connect at  [phone] - they can help you locate a primary care doctor that  accepts your insurance, provides certain services, etc. - Physician Referral Service- [phone]  Chronic Pain Problems: Organization         Address  Phone    Notes  [facility]  [phone] Patients need to be referred by their primary care doctor.   Medication Assistance: Organization         Address  Phone   Notes  [facility] Medication [facility] [address] [place], [place] [address] [phone] --Must be a resident of [facility] -- Must have NO insurance coverage whatsoever (no Medicaid/ Medicare, etc.) -- The pt. MUST have a primary care doctor that directs their care regularly and follows them in the community   [facility]  [phone]   [employer]  [phone]    Behavioral Health Resources in the Community: Intensive Outpatient Programs Organization         Address  Phone  Notes  [facility] Services [address] [place], [place], [place] [phone]   [facility] Outpatient [address] [place], [place] [phone]   ADS: Alcohol [facility] Drug Sv[facility] [address], [place], [place]  [phone]   [facility] [address]  [place], [place] [phone] or [phone]   Substance Abuse Resources Organization         Address  Phone  Notes  Alcohol and Drug Services  ([phone]  [facility]  [phone]   [facility] [place]  5073158999   Floydene Flock  [phone]   Residential & Outpatient Substance Abuse Program  [phone]   Psychological Services Organization         Address  Phone  Notes  [facility]  [phone]   [facility]  [phone]   [facility] Mental Health [address]enessa Wick[place], [place] [phone] or [phone]    Mobile Crisis Teams Organization         Address  Phone  Notes  [facility], Mobile Crisis Care Unit  [phone]   [facility]  [address] [place], [place] [phone]   Doristine Locks [address] [place] [phone]    Self-Help/Support Groups Organization          Address  Phone             Notes  [facility] [place] - variety of support groups  336- I7437963 Call for more information  Narcotics Anonymous (NA), [facility] [address], [employer]  2 meetings at this location   Statistician         Address  Phone  Notes  [facility] [address],    [place]  [phone]   [facility] [place] [facility]  [address] [place] [address], [place] [address], [place], [place] [phone]   [facility] [address] [place], [place] [phone] Admissions: 8am-3pm M-F  [facility] [address],    [place], [place] [phone]   [facility] [address] [place], [place], [place] [phone]   [facility] [address],  [place], [place] [phone]   [facility] - Intensive Outpatient [address], [place], [place] [phone]   [facility] (Addiction [facility] [address] [place]  [place], [place] [phone] or [phone]   [facility] ([facility]) [address], [place], [place] [phone] Accepts Medicaid  [facility] [place] [address],  [place] [phone] Substance Abuse/Addiction Treatment   [facility] Organization         Address  Phone  Notes  [facility]  [phone]   Angie Fava, PhD [address] Ervin Knack [place], [place]   [phone] or [phone]   [facility] Behavioral   [address] [place], [place] [phone]   [facility] [address], [place], [place] [phone] Insurance/Medicaid/sponsorship through [facility] and Families [address]                                    [place], [place] [phone] Therapy/tele-psych/case  [facility] [address], [place] [phone]    Dr. Lolly Mustache  [phone]   [facility]  [employer] [facility] Dept. 1) [address] Katilin [place], [place] 2) [address] [place], [place] 3)  [address], [place] [phone]  [phone]   [facility] Child Abuse Hotline 740-840-6688 or [phone] (After Hours)      Behavioral Health Resources in the [facility]  Intensive Outpatient Programs: [facility]      [address] [place], [place] [phone] Both a day and evening program  [facility] Outpatient     [address]        [place], [place] [address] [phone]         ADS: Alcohol & Drug Svcs [address] [place] [phone]  [facility] Mental Health ACCESS LINE: [phone] or [phone] [address] Eugenio [place], [place] [address] [email or web address]  Mobile Crisis Teams:                                        [facility]         Mobile Crisis Care Unit [phone]             [facility] [address] Ginette Otto [phone]                                         [facility] [address] [place] [phone]  Self-Help/Support Groups: [facility] of [employer] of support groups [phone] (call for more info)  Narcotics Anonymous (NA) [facility] [address] [place] - 2 meetings at this location  Residential Treatment Programs:  [facility]      [address]        [place]       [phone]         [facility] [address], [place] [address] [place], [place]  [address] [phone]  [facility]  [address] [place], [place] [address] [phone] Admissions: 8am-3pm M-F  [facility]     [address]        [place], [place] [address]       [phone]         [facility] [address] Starling Manns [place], [place] [phone]  [facility] [address] [place], [place] [phone]  [facility] - Intensive Outpatient      [address]     [place], [place]       [phone]         [facility] (Addiction Recovery Care [facility]     [address] [place], [place] [phone] or [phone]  [facility] ([facility])  [address] [place], [place] [phone]  [facility] [address]                                               [address] [place] [phone]  [facility] Resources: [phone]               General Therapy                                                Angie Fava, PhD        [phone]  [place], [place] [address]         [phone]   Insurance  [facility] Behavioral   [address] [place], [place] [address] [phone]  [facility] Recovery [address] [place], [place] [address] [phone] Insurance/Medicaid/sponsorship through [facility]                                              [address]                                        [place], [place] [address]    Therapy/tele-psych/case         [phone]          [facility] [address], [place]  [address]  Adolescent/group home/case management [phone]                                           Creola Corn PhD       General therapy       Insurance   [id number]         Dr. Lolly Mustache Insurance [phone] M-F  [place] [facility]Residential Medicaid, sponsorship [id number]

## 2015-02-11 NOTE — ED Provider Notes (Signed)
CSN: [id number]     Arrival date & time 02/11/15  2021 History   First MD Initiated Contact with Patient 02/11/15 2054     Chief Complaint  Patient presents with  . Alcohol Problem     (Consider location/radiation/quality/duration/timing/severity/associated sxs/prior Treatment) HPI Comments: Dakota Steele is a 55 y.o. male with a PMHx of alcoholism, who presents to the ED with complaints of alcoholism requesting help with detox. He states he has been to detox facility in [place] in the past, was there for 3 days and after leaving he went right back to drinking heavily. He states today he had 7-8 cans of beer, last sip at 7 PM, and states this is approximately half of his normal intake. He reports that typically after a night of drinking, he will wake up in the morning with some tremors, but he is not having this currently. He has never had any alcohol withdrawal seizures or DTs. He denies any SI/HI/AVH or illicit drug use. He has no medical complaints at this time. Denies ongoing tremors currently.   Patient is a 55 y.o. male presenting with alcohol problem. The history is provided by the patient. No language interpreter was used.  Alcohol Problem This is a chronic problem. The current episode started more than 1 year ago. The problem occurs constantly. The problem has been unchanged. Pertinent negatives include no abdominal pain, arthralgias, chest pain, chills, fever, myalgias, nausea, numbness, urinary symptoms, vomiting or weakness. The symptoms are aggravated by drinking. He has tried nothing for the symptoms. The treatment provided no relief.    History reviewed. No pertinent past medical history. History reviewed. No pertinent past surgical history. Family History  Problem Relation Age of Onset  . Hypertension Mother    Social History  Substance Use Topics  . Smoking status: Current Every Day Smoker  . Smokeless tobacco: None  . Alcohol Use: 0.0 oz/week    0 Standard drinks or  equivalent per week    Review of Systems  Constitutional: Negative for fever and chills.  Respiratory: Negative for shortness of breath.   Cardiovascular: Negative for chest pain.  Gastrointestinal: Negative for nausea, vomiting, abdominal pain, diarrhea and constipation.  Genitourinary: Negative for dysuria and hematuria.  Musculoskeletal: Negative for myalgias and arthralgias.  Skin: Negative for color change.  Allergic/Immunologic: Negative for immunocompromised state.  Neurological: Negative for tremors (none now), weakness and numbness.  Psychiatric/Behavioral: Negative for suicidal ideas, hallucinations and confusion.   10 Systems reviewed and are negative for acute change except as noted in the HPI.    Allergies  Review of patient's allergies indicates no known allergies.  Home Medications   Prior to Admission medications   Medication Sig Start Date End Date Taking? Authorizing Provider  albuterol (PROVENTIL HFA;VENTOLIN HFA) 108 (90 BASE) MCG/ACT inhaler Inhale 2 puffs into the lungs every 6 (six) hours as needed for wheezing or shortness of breath. 08/03/14   Thao P Le, DO  azithromycin (ZITHROMAX) 250 MG tablet Take 2 tabs po now then 1 tab po daily for the next 4 days 08/03/14   Thao P Le, DO  cyclobenzaprine (FLEXERIL) 5 MG tablet Take 1 tablet (5 mg total) by mouth at bedtime as needed. 08/03/14   Thao P Le, DO  methylPREDNISolone (MEDROL DOSEPAK) 4 MG TBPK tablet Take as directed 08/03/14   Thao P Le, DO  naproxen (NAPROSYN) 500 MG tablet Take 1 tablet (500 mg total) by mouth 2 (two) times daily with a meal. Do not take  with prednisone or other NSAIDS, take after steroid pack 08/03/14   Thao P Le, DO  traMADol (ULTRAM) 50 MG tablet Take 1 tablet (50 mg total) by mouth every 8 (eight) hours as needed. Can cause constipation, do not drink alcohol with this 08/03/14   Thao P Le, DO   BP 142/91 mmHg  Pulse 85  Temp(Src) 97.4 F (36.3 C)  Resp 18  SpO2 99% Physical Exam   Constitutional: He is oriented to person, place, and time. Vital signs are normal. He appears well-developed and well-nourished.  Non-toxic appearance. No distress.  Afebrile, nontoxic, NAD  HENT:  Head: Normocephalic and atraumatic.  Mouth/Throat: Oropharynx is clear and moist and mucous membranes are normal.  Eyes: Conjunctivae and EOM are normal. Right eye exhibits no discharge. Left eye exhibits no discharge.  Neck: Normal range of motion. Neck supple.  Cardiovascular: Normal rate, regular rhythm, normal heart sounds and intact distal pulses.  Exam reveals no gallop and no friction rub.   No murmur heard. Pulmonary/Chest: Effort normal and breath sounds normal. No respiratory distress. He has no decreased breath sounds. He has no wheezes. He has no rhonchi. He has no rales.  Abdominal: Soft. Normal appearance and bowel sounds are normal. He exhibits no distension. There is no tenderness. There is no rigidity, no rebound, no guarding, no CVA tenderness, no tenderness at McBurney's point and negative Murphy's sign.  Musculoskeletal: Normal range of motion.  No tremors MAE x4 Strength and sensation grossly intact Distal pulses intact  Neurological: He is alert and oriented to person, place, and time. He has normal strength. No sensory deficit.  Goal oriented speech, A&O x4, no focal neuro deficits  Skin: Skin is warm, dry and intact. No rash noted.  Psychiatric: He has a normal mood and affect. He is not actively hallucinating. He expresses no homicidal and no suicidal ideation. He expresses no suicidal plans and no homicidal plans.  No SI/HI/AVH endorsed  Nursing note and vitals reviewed.   ED Course  Procedures (including critical care time)  20:47:43 CIWA Alcohol Scale MQ  CIWA-Ar - Nausea and Vomiting: no nausea and no vomiting ; Tactile Disturbances: none ; Tremor: no tremor ; Auditory Disturbances: not present ; Paroxysmal Sweats: no sweat visible ; Visual Disturbances: not  present ; Anxiety: no anxiety, at ease ; Headache, Fullness in Head: none present ; Agitation: normal activity ; Orientation and Clouding of Sensorium: oriented and can do serial additions ; CIWA-Ar Total: 0       Labs Review Labs Reviewed - No data to display  Imaging Review No results found. I have personally reviewed and evaluated these images and lab results as part of my medical decision-making.   EKG Interpretation None      MDM   Final diagnoses:  Alcohol abuse    55 y.o. male here requesting help with his alcoholism. He admits to drinking 7-8 cans of beer this evening, last drink was at 7 PM. He states is about half of his normal intake. His been to detox before and started drinking again right after leaving detox. He is not having any withdrawal symptoms, and has no history of DTs in the past. No SI/HI/AVH or other concerning symptoms. His CIWA score is 0. Discussed use of Librium but do NOT combine with EtOH. Will send home with folate and MVI. Given list of detox facilities in the area, discussed follow-up care with them beginning tomorrow, and follow up with his regular doctor in 1 week  for recheck. I explained the diagnosis and have given explicit precautions to return to the ER including for any other new or worsening symptoms. The patient understands and accepts the medical plan as it's been dictated and I have answered their questions. Discharge instructions concerning home care and prescriptions have been given. The patient is STABLE and is discharged to home in good condition.  BP 142/91 mmHg  Pulse 85  Temp(Src) 97.4 F (36.3 C)  Resp 18  SpO2 99%  Meds ordered this encounter  Medications  . chlordiazePOXIDE (LIBRIUM) 25 MG capsule    Sig:  PO TID x 1D, then 25-50mg  PO BID X 1D, then 25-50mg  PO QD X 1D    Dispense:  10 capsule    Refill:  0    Order Specific Question:  Supervising Provider    Answer:  MILLER, BRIAN [[id number]]  . folic acid (FOLVITE) 1 MG  tablet    Sig: Take 1 tablet (1 mg total) by mouth daily.    Dispense:  30 tablet    Refill:  0    Order Specific Question:  Supervising Provider    Answer:  MILLER, BRIAN [[id number]]  . Multiple Vitamins-Minerals (MULTIVITAMIN WITH MINERALS) tablet    Sig: Take 1 tablet by mouth daily.    Dispense:  30 tablet    Refill:  0    Order Specific Question:  Supervising Provider    Answer:  Eber Hong [[id number]]       Jahzion Brogden Camprubi-Soms, PA-C 02/11/15 2116  Eber Hong, MD 02/12/15 2349

## 2015-02-11 NOTE — ED Notes (Addendum)
Pt states he wants to stop drinking. Pt states he does not want to go to rehab, pt states he would rather have medication to help him to stop drinking on his own. Pt states he last had a beer at 7PM today. Pt states he has tremors when stops drinking.

## 2015-02-15 ENCOUNTER — Ambulatory Visit (INDEPENDENT_AMBULATORY_CARE_PROVIDER_SITE_OTHER): Payer: 59 | Admitting: Family Medicine

## 2015-02-15 VITALS — BP 162/102 | HR 93 | Temp 98.3°F | Resp 17 | Ht 67.5 in | Wt 164.0 lb

## 2015-02-15 DIAGNOSIS — F1010 Alcohol abuse, uncomplicated: Secondary | ICD-10-CM

## 2015-02-15 DIAGNOSIS — R112 Nausea with vomiting, unspecified: Secondary | ICD-10-CM

## 2015-02-15 MED ORDER — CHLORDIAZEPOXIDE HCL 25 MG PO CAPS: ORAL_CAPSULE | ORAL | Status: AC

## 2015-02-15 MED ORDER — CHLORDIAZEPOXIDE HCL 25 MG PO CAPS: ORAL_CAPSULE | ORAL | Status: DC

## 2015-02-15 MED ORDER — ONDANSETRON 4 MG PO TBDP: 4.0000 mg | ORAL_TABLET | Freq: Three times a day (TID) | ORAL | Status: DC | PRN

## 2015-02-15 NOTE — Progress Notes (Signed)
[facility] [address] [place] [address] [phone]  Date:  02/15/2015   Name:  Dakota Steele   DOB:  11/22/1960   MRN:  [id number]  PCP:  No primary care provider on file.    Chief Complaint: ETOH and Hypertension   History of Present Illness:  This is a 55 y.o. male with PMH alcohol abuse who is presenting for f/u alc abuse. Pt has had problems with alcohol abuse for many years, couldn't get an exact amount of time. About 7 years ago he stopped cold [place] on his own and abstained for 4 years. He started back about 2 years ago. He checked himself into a detox program in [place] and was there for 3 days. They released him and he abstained for 1 week and then started drinking again. He recently moved here from [place] and moved in with his mother. He moved here to get help. He went to the ED on 02/11/15 for alcohol detox. He was stable at that time. Discharged with librium and thiamine. Pt states today that he has not started the librium because he is scared to take it. He usually drinks 12-15 budweisers a day. He has cut back to 4-5 a day. He feels that he needs to continue drinking a small amount of alcohol until it is out of his system. He is worried about taking the librium while drinking so hasn't started. He is having tremors and sweats. He feels hot and cold off and on. He has been vomiting some. He denies DTs. No SI/HI. No illicit drug use. States he contacted some detox facilities in town and none will take his insurance. He states he is getting medicare and will kick in in 1 month.  Review of Systems:  Review of Systems See HPI  There are no active problems to display for this patient.   Prior to Admission medications   Medication Sig Start Date End Date Taking? Authorizing Provider  albuterol (PROVENTIL HFA;VENTOLIN HFA) 108 (90 BASE) MCG/ACT inhaler Inhale 2 puffs into the lungs every 6 (six) hours as needed for wheezing or shortness of  breath. 08/03/14  Yes Thao P Le, DO  chlordiazePOXIDE (LIBRIUM) 25 MG capsule  PO TID x 1D, then 25-50mg  PO BID X 1D, then 25-50mg  PO QD X 1D 02/11/15  Yes Mercedes Camprubi-Soms, PA-C  cyclobenzaprine (FLEXERIL) 5 MG tablet Take 1 tablet (5 mg total) by mouth at bedtime as needed. 08/03/14  Yes Thao P Le, DO  folic acid (FOLVITE) 1 MG tablet Take 1 tablet (1 mg total) by mouth daily. 02/11/15  Yes Mercedes Camprubi-Soms, PA-C  Multiple Vitamins-Minerals (MULTIVITAMIN WITH MINERALS) tablet Take 1 tablet by mouth daily. 02/11/15  Yes Mercedes Camprubi-Soms, PA-C  naproxen (NAPROSYN) 500 MG tablet Take 1 tablet (500 mg total) by mouth 2 (two) times daily with a meal. Do not take with prednisone or other NSAIDS, take after steroid pack 08/03/14  Yes Thao P Le, DO  traMADol (ULTRAM) 50 MG tablet Take 1 tablet (50 mg total) by mouth every 8 (eight) hours as needed. Can cause constipation, do not drink alcohol with this 08/03/14  Yes Thao P Le, DO    No Known Allergies  History reviewed. No pertinent past surgical history.  Social History  Substance Use Topics  . Smoking status: Current Every Day Smoker -- 1.50 packs/day for 38 years    Types: Cigarettes  . Smokeless tobacco: None  . Alcohol Use: 0.0 oz/week  0 Standard drinks or equivalent per week    Family History  Problem Relation Age of Onset  . Hypertension Mother     Medication list has been reviewed and updated.  Physical Examination:  Physical Exam  Constitutional: He is oriented to person, place, and time. He appears well-developed and well-nourished. No distress.  Mild diaphoresis Tremors present  HENT:  Head: Normocephalic and atraumatic.  Right Ear: Hearing normal.  Left Ear: Hearing normal.  Nose: Nose normal.  Mouth/Throat: Uvula is midline, oropharynx is clear and moist and mucous membranes are normal.  Eyes: Conjunctivae and lids are normal. Right eye exhibits no discharge. Left eye exhibits no discharge. No scleral  icterus.  Cardiovascular: Normal rate, regular rhythm, normal heart sounds and normal pulses.   No murmur heard. Pulmonary/Chest: Effort normal and breath sounds normal. No respiratory distress. He has no wheezes. He has no rhonchi. He has no rales.  Musculoskeletal: Normal range of motion.  Lymphadenopathy:    He has no cervical adenopathy.  Neurological: He is alert and oriented to person, place, and time.  Skin: Skin is warm, dry and intact. No lesion and no rash noted.  Psychiatric: He has a normal mood and affect. His speech is normal and behavior is normal. Thought content normal.   BP 162/102 mmHg  Pulse 93  Temp(Src) 98.3 F (36.8 C) (Oral)  Resp 17  Ht 5' 7.5" (1.715 m)  Wt 164 lb (74.39 kg)  BMI 25.29 kg/m2  SpO2 95%  Assessment and Plan:  1. Alcohol abuse 2. Nausea with vomiting Pt with signs of withdrawal - diaphoresis, tremors, n/v. No sign of DTs. Discussed with patient how librium should be used. He will stop alcohol completely. He will take librium 25-50mg  on an as needed basis for withdrawal symptoms. Take 1-2 tabs every 6-12 hours as needed for first 2 days. Then cut to only 1 tab every 6-12 hours as needed. Return on 02/18/15 for follow up. zofran as needed for nausea. Discussed importance of hydration. Continue thiamine tabs. Suspect BP elevated d/t withdrawal. Will recheck in 3 days. - chlordiazePOXIDE (LIBRIUM) 25 MG capsule; 1-2 tabs po every 6-12 hours as needed for alcohol withdrawal symptoms  Dispense: 20 capsule; Refill: 0 - ondansetron (ZOFRAN ODT) 4 MG disintegrating tablet; Take 1 tablet (4 mg total) by mouth every 8 (eight) hours as needed for nausea or vomiting.  Dispense: 20 tablet; Refill: 0  Discussed case with Dr. Patsy Lager.  Roswell Miners Dyke Brackett, MHS [facility]  02/15/2015

## 2015-02-15 NOTE — Patient Instructions (Signed)
Stop drinking alcohol. Take 1-2 tabs of librium every 6-12 hours as needed for alcohol withdrawal symptoms. Do this for 2 days. On day 3, cut back to only 1 tab of librium every 6-12 hours as needed. Drink lots of water. Continue the b1 supplement. Return on Thursday for follow up with me - I will be here from 9 am to 8:30 PM.  Return sooner if needed.

## 2015-02-18 ENCOUNTER — Ambulatory Visit (INDEPENDENT_AMBULATORY_CARE_PROVIDER_SITE_OTHER): Payer: 59 | Admitting: Emergency Medicine

## 2015-02-18 VITALS — BP 128/84 | HR 102 | Temp 99.2°F | Resp 18 | Ht 67.75 in | Wt 163.4 lb

## 2015-02-18 DIAGNOSIS — F1010 Alcohol abuse, uncomplicated: Secondary | ICD-10-CM

## 2015-02-18 DIAGNOSIS — F10230 Alcohol dependence with withdrawal, uncomplicated: Secondary | ICD-10-CM | POA: Diagnosis not present

## 2015-02-18 DIAGNOSIS — F10930 Alcohol use, unspecified with withdrawal, uncomplicated: Secondary | ICD-10-CM

## 2015-02-18 NOTE — Patient Instructions (Signed)
Check out AA meetings before you leave. Then continue when you get to [facility].  As soon as you get home, follow up with your primary care provider and come up with a plan for staying clean. Continue the thiamine daily. Get refills with your PCP. Continue the librium as needed for withdrawal symptoms. If you need more, get from your PCP. Continue hydration.

## 2015-02-18 NOTE — Progress Notes (Signed)
[facility] [address] [place] [address] [phone]  Date:  02/18/2015   Name:  Dakota Steele   DOB:  06-25-1960   MRN:  [id number]  PCP:  No primary care provider on file.    Chief Complaint: Follow-up   History of Present Illness:  This is a 55 y.o. male with PMH alcohol abuse who is presenting for follow up. He was seen here 1/23 wanting guidance on withdrawal from alcohol. He had been seen in the ED 02/11/15 wanting detox. They discharged him with librium and thiamine. When I saw him he had not yet started the librium. He was unsure of how to take it. He was instead trying to wean himself off alcohol. He was drinking 4-5 budweisers a day - he would typically drink 12-15 budweisers a day. He was having tremors, diaphoresis and n/v at that time. No DTs or seizures. He was counseled on how to use the librium - on demand for withdrawal seizures, 1-2 tabs every 6-12 hours prn. He states he has been using 1 tab three times a day. He still has some tremors but much better than before. No longer with diaphoresis and hot/cold spells. No longer with n/v. He has not had a drink since we last saw each other. He was having problems with his memory before. He states he is still having problems with his memory but better than before.  Pt lives in [place]. He moved down here to live with his mother while he got help. He has been here for 2 weeks now. He states he is wanting to go back to [place]. He has a PCP there. His fiance also lives in [place] - he reports she does not drink. He has never tried AA. He is open to trying.   Review of Systems:  Review of Systems See HPI  Patient Active Problem List   Diagnosis Date Noted  . Alcohol abuse 02/15/2015    Prior to Admission medications   Medication Sig Start Date End Date Taking? Authorizing Provider  albuterol (PROVENTIL HFA;VENTOLIN HFA) 108 (90 BASE) MCG/ACT inhaler Inhale 2 puffs into the lungs every 6 (six)  hours as needed for wheezing or shortness of breath. 08/03/14  Yes Thao P Le, DO  chlordiazePOXIDE (LIBRIUM) 25 MG capsule 1-2 tabs po every 6-12 hours as needed for alcohol withdrawal symptoms 02/15/15  Yes Lanier Clam V, PA-C  cyclobenzaprine (FLEXERIL) 5 MG tablet Take 1 tablet (5 mg total) by mouth at bedtime as needed. 08/03/14  Yes Thao P Le, DO  folic acid (FOLVITE) 1 MG tablet Take 1 tablet (1 mg total) by mouth daily. 02/11/15  Yes Mercedes Camprubi-Soms, PA-C  Multiple Vitamins-Minerals (MULTIVITAMIN WITH MINERALS) tablet Take 1 tablet by mouth daily. 02/11/15  Yes Mercedes Camprubi-Soms, PA-C  naproxen (NAPROSYN) 500 MG tablet Take 1 tablet (500 mg total) by mouth 2 (two) times daily with a meal. Do not take with prednisone or other NSAIDS, take after steroid pack 08/03/14  Yes Thao P Le, DO  ondansetron (ZOFRAN ODT) 4 MG disintegrating tablet Take 1 tablet (4 mg total) by mouth every 8 (eight) hours as needed for nausea or vomiting. 02/15/15  Yes Lanier Clam V, PA-C  traMADol (ULTRAM) 50 MG tablet Take 1 tablet (50 mg total) by mouth every 8 (eight) hours as needed. Can cause constipation, do not drink alcohol with this 08/03/14  Yes Thao P Le, DO    No Known Allergies  History reviewed.  No pertinent past surgical history.  Social History  Substance Use Topics  . Smoking status: Current Every Day Smoker -- 1.50 packs/day for 38 years    Types: Cigarettes  . Smokeless tobacco: None  . Alcohol Use: 0.0 oz/week    0 Standard drinks or equivalent per week    Family History  Problem Relation Age of Onset  . Hypertension Mother     Medication list has been reviewed and updated.  Physical Examination:  Physical Exam  Constitutional: He is oriented to person, place, and time. He appears well-developed and well-nourished. No distress.  Mild hand tremors present No diaphoresis  HENT:  Head: Normocephalic and atraumatic.  Right Ear: Hearing normal.  Left Ear: Hearing normal.  Nose:  Nose normal.  Eyes: Conjunctivae and lids are normal. Right eye exhibits no discharge. Left eye exhibits no discharge. No scleral icterus.  Cardiovascular: Regular rhythm, normal heart sounds and normal pulses.   No murmur heard. Mild tachycardia  Pulmonary/Chest: Effort normal and breath sounds normal. No respiratory distress. He has no wheezes. He has no rhonchi. He has no rales.  Musculoskeletal: Normal range of motion.  Neurological: He is alert and oriented to person, place, and time.  Skin: Skin is warm, dry and intact. No lesion and no rash noted.  Psychiatric: He has a normal mood and affect. His speech is normal and behavior is normal. Thought content normal.    BP 128/84 mmHg  Pulse 102  Temp(Src) 99.2 F (37.3 C) (Oral)  Resp 18  Ht 5' 7.75" (1.721 m)  Wt 163 lb 6.4 oz (74.118 kg)  BMI 25.02 kg/m2  SpO2 98%  Assessment and Plan:  1. Alcohol withdrawal, uncomplicated (HCC) 2. Alcohol abuse Pt doing well. Withdrawal symptoms improving. Still with mild tremor. No longer with diaphoresis or n/v. BP improved. Pt feels he is ready to move back to [place]. I advised he stay here a little longer until he feels more stable - however sounds like pt will be returned to [place] asap. Continue using librium on demand every 6-12 hours as needed for withdrawal symptoms. Continue thiamine daily. Suggested he try AA and see if helpful. Advised when he returns to [facility] that he schedule appt with PCP asap. Try to get in a recovery program. Return or call if needed.   Roswell Miners Dyke Brackett, MHS [facility]  02/18/2015

## 2015-03-10 ENCOUNTER — Telehealth: Payer: Self-pay

## 2015-03-10 DIAGNOSIS — F419 Anxiety disorder, unspecified: Secondary | ICD-10-CM

## 2015-03-10 MED ORDER — HYDROXYZINE HCL 25 MG PO TABS: 12.5000 mg | ORAL_TABLET | Freq: Three times a day (TID) | ORAL | Status: AC | PRN

## 2015-03-10 NOTE — Telephone Encounter (Signed)
Pt is needing to talk with nicole -about getting on stress medication he is now in [place] which he says she knows and that the provider he saw in [place] says he needs to be on stress meds  Best number [phone]

## 2015-03-10 NOTE — Telephone Encounter (Signed)
Pt states he is under a lot of stress and can't calm down. He states he is planning to come back down to [place] because things are not going well in [place]. He has drank a few beers but otherwise states he has been abstaining. Will send in atarax. Return to see me as soon as he returns to [place].

## 2015-03-23 ENCOUNTER — Telehealth: Payer: Self-pay

## 2015-03-23 NOTE — Telephone Encounter (Signed)
Patient is calling to request refills for folic acid and chlordiapoxide and to [facility] in [place], [place].

## 2015-03-23 NOTE — Telephone Encounter (Signed)
I am unsure about if I can refill this medication.

## 2015-03-24 NOTE — Telephone Encounter (Signed)
Pt either needs to establish with someone in [place] (I strongly suggest this) or come to see me if he returns to [place].

## 2015-03-24 NOTE — Telephone Encounter (Signed)
Called pt and advised message from provider on their voicemail.  

## 2015-03-25 ENCOUNTER — Telehealth: Payer: Self-pay

## 2015-03-25 NOTE — Telephone Encounter (Signed)
Pt needs the acid pills and also something for depression. Pt doesn't want the chlordiazepoxide. He doesn't know when he will come back  Please advise him   [phone]

## 2015-03-25 NOTE — Telephone Encounter (Signed)
Dakota Steele, you saw this patient on 02/18/2015 for alcohol withdrawal and depression. He now wants to take medication for the depression. He stated that since he has been on the chlordiazepoxide , his depression symptoms have become worse and he has stopped taking that medication completely.  I advised him to come into [place], but he stated that wouldn't be possible since he is in [place]. Also advised him that if he had any suicidal thoughts, to report to the nearest hospital.     He also stated that he wanted a refill on his folic acid medication. If possible, he prefers his medications to be sent to the [facility] in [place], [place].   Best call back number is [phone].

## 2015-03-25 NOTE — Telephone Encounter (Signed)
"  Pt either needs to establish with someone in [place] (I strongly suggest this) or come to see me if he returns to [place]."  That was my response to a phone call on 3/117. I am not treating this patient from another state. He needs to establish with a provider in [place]. I want him to do well -- and it would be in his best interest to establish care with a provider in [place] who can keep a close eye on him.

## 2015-10-14 ENCOUNTER — Ambulatory Visit (INDEPENDENT_AMBULATORY_CARE_PROVIDER_SITE_OTHER): Payer: Medicare Other | Admitting: Physician Assistant

## 2015-10-14 VITALS — BP 128/60 | HR 66 | Temp 98.3°F | Ht 67.75 in | Wt 159.0 lb

## 2015-10-14 DIAGNOSIS — R9431 Abnormal electrocardiogram [ECG] [EKG]: Secondary | ICD-10-CM | POA: Diagnosis not present

## 2015-10-14 DIAGNOSIS — I499 Cardiac arrhythmia, unspecified: Secondary | ICD-10-CM | POA: Diagnosis not present

## 2015-10-14 DIAGNOSIS — J4530 Mild persistent asthma, uncomplicated: Secondary | ICD-10-CM | POA: Diagnosis not present

## 2015-10-14 DIAGNOSIS — K219 Gastro-esophageal reflux disease without esophagitis: Secondary | ICD-10-CM

## 2015-10-14 DIAGNOSIS — F1010 Alcohol abuse, uncomplicated: Secondary | ICD-10-CM | POA: Diagnosis not present

## 2015-10-14 LAB — COMPLETE METABOLIC PANEL WITH GFR
CO2: 24 mmol/L (ref 20–31)
Chloride: 105 mmol/L (ref 98–110)
Creat: 0.83 mg/dL (ref 0.70–1.33)
GFR, Est African American: >89 mL/min (ref >=60)
GFR, Est Non African American: >89 mL/min (ref >=60)
Glucose, Bld: 81 mg/dL (ref 65–99)
Potassium: 4.2 mmol/L (ref 3.5–5.3)
Sodium: 140 mmol/L (ref 135–146)
Total Bilirubin: 0.5 mg/dL (ref 0.2–1.2)
Total Protein: 6.8 g/dL (ref 6.1–8.1)

## 2015-10-14 LAB — POCT CBC
Granulocyte percent: 65.6 %G (ref 37–80)
HCT, POC: 45 % (ref 43.5–53.7)
Hemoglobin: 16.3 g/dL (ref 14.1–18.1)
Lymph, poc: 3.7 — AB (ref 0.6–3.4)
MCH, POC: 38.2 pg — AB (ref 27–31.2)
MCHC: 36.2 g/dL — AB (ref 31.8–35.4)
MCV: 105.6 fL — AB (ref 80–97)
MID (cbc): 0.2 (ref 0–0.9)
MPV: 7.7 fL (ref 0–99.8)
POC Granulocyte: 7.3 — AB (ref 2–6.9)
POC LYMPH PERCENT: 32.7 %L (ref 10–50)
POC MID %: 1.7 %M (ref 0–12)
Platelet Count, POC: 227 10*3/uL (ref 142–424)
RBC: 4.26 M/uL — AB (ref 4.69–6.13)
RDW, POC: 12.3 %
WBC: 11.2 10*3/uL — AB (ref 4.6–10.2)

## 2015-10-14 LAB — COMPLETE METABOLIC PANEL WITHOUT GFR
ALT: 15 U/L (ref 9–46)
AST: 23 U/L (ref 10–35)
Albumin: 4.3 g/dL (ref 3.6–5.1)
Alkaline Phosphatase: 79 U/L (ref 40–115)
BUN: 5 mg/dL — ABNORMAL LOW (ref 7–25)
Calcium: 9.4 mg/dL (ref 8.6–10.3)

## 2015-10-14 LAB — MAGNESIUM: Magnesium: 2.1 mg/dL (ref 1.5–2.5)

## 2015-10-14 LAB — VITAMIN B12: Vitamin B-12: 782 pg/mL (ref 200–1100)

## 2015-10-14 MED ORDER — OMEPRAZOLE 40 MG PO CPDR
40.0000 mg | DELAYED_RELEASE_CAPSULE | Freq: Every day | ORAL | 0 refills | Status: DC
Start: 2015-10-14 — End: 2016-01-14

## 2015-10-14 MED ORDER — BUDESONIDE-FORMOTEROL FUMARATE 160-4.5 MCG/ACT IN AERO: 2.0000 | INHALATION_SPRAY | Freq: Two times a day (BID) | RESPIRATORY_TRACT | 3 refills | Status: AC

## 2015-10-14 MED ORDER — ALBUTEROL SULFATE HFA 108 (90 BASE) MCG/ACT IN AERS: 2.0000 | INHALATION_SPRAY | Freq: Four times a day (QID) | RESPIRATORY_TRACT | 0 refills | Status: DC | PRN

## 2015-10-14 NOTE — Patient Instructions (Addendum)
Cardiology will call you in order to set up an appointment to check your heart. Please follow-up and schedule an appointment with them.  If you still need to use your Ventolin every day, return to our clinic and see me. We will adjust your medication dosage. You should not have to use your rescue inhaler (ventolin) as often as you are.   Thank you for coming in today. I hope you feel we met your needs.  Feel free to call [facility] if you have any questions or further requests.  Please consider signing up for MyChart if you do not already have it, as this is a great way to communicate with me.  Best,  Whitney McVey, PA-C   IF you received an x-ray today, you will receive an invoice from [facility]. Please contact [facility] at [phone] with questions or concerns regarding your invoice.   IF you received labwork today, you will receive an invoice from [employer]. Please contact [facility] at [phone] with questions or concerns regarding your invoice.   Our billing staff will not be able to assist you with questions regarding bills from these companies.  You will be contacted with the lab results as soon as they are available. The fastest way to get your results is to activate your My Chart account. Instructions are located on the last page of this paperwork. If you have not heard from [place] regarding the results in 2 weeks, please contact this office.

## 2015-10-14 NOTE — Progress Notes (Signed)
Dakota PilotRobert D Steele  MRN: [id number] DOB: 09/23/1960  PCP: No PCP Per Patient  Subjective:  Pt is a 55 year old male, history of asthma, GERD and alcohol abuse, presents to clinic for medication refill. Feeling well today, no complaints.   GERD - Controlled with Omeprazole x 2 months. Helping his symptoms, no problems. Takes one daily. Will experience reflux if eats spicy foods without taking medication.   Asthma - Controlled with Ventolin and Symbicort. Has been out of Symbicort for a few months.  Ventolin taking when needed, which is three times a day. When on Symbicort, still using Albuterol multiple times a day. Wheezing is worse at night and when he lays down.  Wakes up at night with cough and wheezing once every two weeks. [place] in [place]. Is unsure of his medication doses.   Drinks daily, 6 beers/day. Takes Folic acid daily.  Smoker. Daily, 1.5 packs/day x 38 years.   Review of Systems  Constitutional: Negative for chills, diaphoresis, fatigue and fever.  Respiratory: Positive for cough and wheezing. Negative for chest tightness and shortness of breath.   Cardiovascular: Negative.   Gastrointestinal: Negative.     Patient Active Problem List   Diagnosis Date Noted  . Alcohol abuse 02/15/2015    Current Outpatient Prescriptions on File Prior to Visit  Medication Sig Dispense Refill  . albuterol (PROVENTIL HFA;VENTOLIN HFA) 108 (90 BASE) MCG/ACT inhaler Inhale 2 puffs into the lungs every 6 (six) hours as needed for wheezing or shortness of breath. 1 Inhaler 0  . chlordiazePOXIDE (LIBRIUM) 25 MG capsule 1-2 tabs po every 6-12 hours as needed for alcohol withdrawal symptoms 20 capsule 0  . cyclobenzaprine (FLEXERIL) 5 MG tablet Take 1 tablet (5 mg total) by mouth at bedtime as needed. 30 tablet 0  . folic acid (FOLVITE) 1 MG tablet Take 1 tablet (1 mg total) by mouth daily. 30 tablet 0  . hydrOXYzine (ATARAX/VISTARIL) 25 MG tablet Take 0.5-1  tablets (12.5-25 mg total) by mouth every 8 (eight) hours as needed for itching. 30 tablet 1  . Multiple Vitamins-Minerals (MULTIVITAMIN WITH MINERALS) tablet Take 1 tablet by mouth daily. 30 tablet 0  . naproxen (NAPROSYN) 500 MG tablet Take 1 tablet (500 mg total) by mouth 2 (two) times daily with a meal. Do not take with prednisone or other NSAIDS, take after steroid pack 30 tablet 0  . traMADol (ULTRAM) 50 MG tablet Take 1 tablet (50 mg total) by mouth every 8 (eight) hours as needed. Can cause constipation, do not drink alcohol with this 30 tablet 0   No current facility-administered medications on file prior to visit.     No Known Allergies  Objective:  BP 128/60 (BP Location: Right Arm, Patient Position: Sitting, Cuff Size: Normal)   Pulse 66   Temp 98.3 F (36.8 C) (Oral)   Ht 5' 7.75" (1.721 m)   Wt 159 lb (72.1 kg)   SpO2 96%   BMI 24.35 kg/m   Physical Exam  Constitutional: He is oriented to person, place, and time and well-developed, well-nourished, and in no distress. No distress.  Cardiovascular: Normal rate, normal heart sounds and normal pulses.  A regularly irregular rhythm present. Exam reveals no distant heart sounds.   No murmur heard. Neurological: He is alert and oriented to person, place, and time. GCS score is 15.  Skin: Skin is warm and dry.  Psychiatric: Mood, memory, affect and judgment normal.  Vitals reviewed.  EKG  Abnormal EKG.  Discussed with Dr. Cleta Alberts. Bigeminy and PVCs, underlying rhythm is not concerning.  Assessment and Plan :  1. Asthma, mild persistent, uncomplicated - Patient asked to RTC if he is still having to use his Ventolin multiple times a day on a daily bases.  Will adjust medication at that time. Patient understands and agrees.  Is taking Symbicort but does not know the dose, and still using Ventolin daily.   -budesonide-formoterol (SYMBICORT) 160-4.5 MCG/ACT inhaler; Inhale 2 puffs into the lungs 2 (two) times daily.  Dispense: 1  Inhaler; Refill: 3 - albuterol (PROVENTIL HFA;VENTOLIN HFA) 108 (90 Base) MCG/ACT inhaler; Inhale 2 puffs into the lungs every 6 (six) hours as needed for wheezing or shortness of breath.  Dispense: 1 Inhaler; Refill: 0  2. Gastroesophageal reflux disease without esophagitis - Refill medication. Check labs today for chronic omeprazole use.  - Vitamin B12 - VITAMIN D 25 Hydroxy (Vit-D Deficiency, Fractures) - Magnesium - omeprazole (PRILOSEC) 40 MG capsule; Take 1 capsule (40 mg total) by mouth daily.  Dispense: 90 capsule; Refill: 0  3. Irregular heart beat 4. Abnormal EKG 5. Alcohol Abuse - EKG is abnormal with irregular heart beat, bigeminy. Patient is asymptomatic today, feeling well. Ambulatory referral to Cardiology. Still smokes and drinks daily, has had three beers today.  - Smoking and drinking cessation encouraged. Discussed with patient this is not good for his heart and can lead to complications.  - EKG 12-Lead - COMPLETE METABOLIC PANEL WITH GFR   Marco Collie, PA-C  [facility] 10/14/2015 1:59 PM

## 2015-10-15 ENCOUNTER — Telehealth: Payer: Self-pay

## 2015-10-15 DIAGNOSIS — J4530 Mild persistent asthma, uncomplicated: Secondary | ICD-10-CM

## 2015-10-15 LAB — VITAMIN D 25 HYDROXY (VIT D DEFICIENCY, FRACTURES): Vit D, 25-Hydroxy: 34 ng/mL (ref 30–100)

## 2015-10-15 MED ORDER — ALBUTEROL SULFATE HFA 108 (90 BASE) MCG/ACT IN AERS: 2.0000 | INHALATION_SPRAY | Freq: Four times a day (QID) | RESPIRATORY_TRACT | 0 refills | Status: AC | PRN

## 2015-10-15 NOTE — Telephone Encounter (Signed)
Pt was seen on 10/15/15 for Asthma, mild persistent, uncomplicated +4 more by Whitney. His insurance will not pay for his albuterol (PROVENTIL HFA;VENTOLIN HFA) 108 (90 Base) MCG/ACT inhaler [[id number]][[id number]]. He would like to be prescribed  Proair at Pharmacy:  [id number] - [place] ([place]), [place] - [address]. Please advise at [phone]

## 2015-10-15 NOTE — Telephone Encounter (Signed)
RE-SENT IN RX OF ALBUTEROL W/NOTE TO FILL WITH PROAIR PER INS COVERAGE. Notified pt.

## 2015-10-26 ENCOUNTER — Encounter: Payer: Self-pay | Admitting: Physician Assistant

## 2016-01-13 ENCOUNTER — Other Ambulatory Visit: Payer: Self-pay | Admitting: Physician Assistant

## 2016-01-13 DIAGNOSIS — K219 Gastro-esophageal reflux disease without esophagitis: Secondary | ICD-10-CM

## 2016-01-14 ENCOUNTER — Other Ambulatory Visit: Payer: Self-pay

## 2016-01-14 DIAGNOSIS — K219 Gastro-esophageal reflux disease without esophagitis: Secondary | ICD-10-CM

## 2016-01-14 MED ORDER — OMEPRAZOLE 40 MG PO CPDR: 40.0000 mg | DELAYED_RELEASE_CAPSULE | Freq: Every day | ORAL | 0 refills | Status: DC

## 2016-01-14 NOTE — Telephone Encounter (Signed)
Surescripts and fax req Omeprazole DR refill sent

## 2016-05-16 ENCOUNTER — Other Ambulatory Visit: Payer: Self-pay | Admitting: Physician Assistant

## 2016-05-16 DIAGNOSIS — K219 Gastro-esophageal reflux disease without esophagitis: Secondary | ICD-10-CM

## 2016-07-23 IMAGING — CR DG LUMBAR SPINE COMPLETE 4+V
5 series · 5 of 5 positions shown · non-contrast
Comparison: None.

CLINICAL DATA: Left-sided pain.  Left-sided sciatica.

EXAM:
LUMBAR SPINE - COMPLETE 4+ VIEW

[AP]
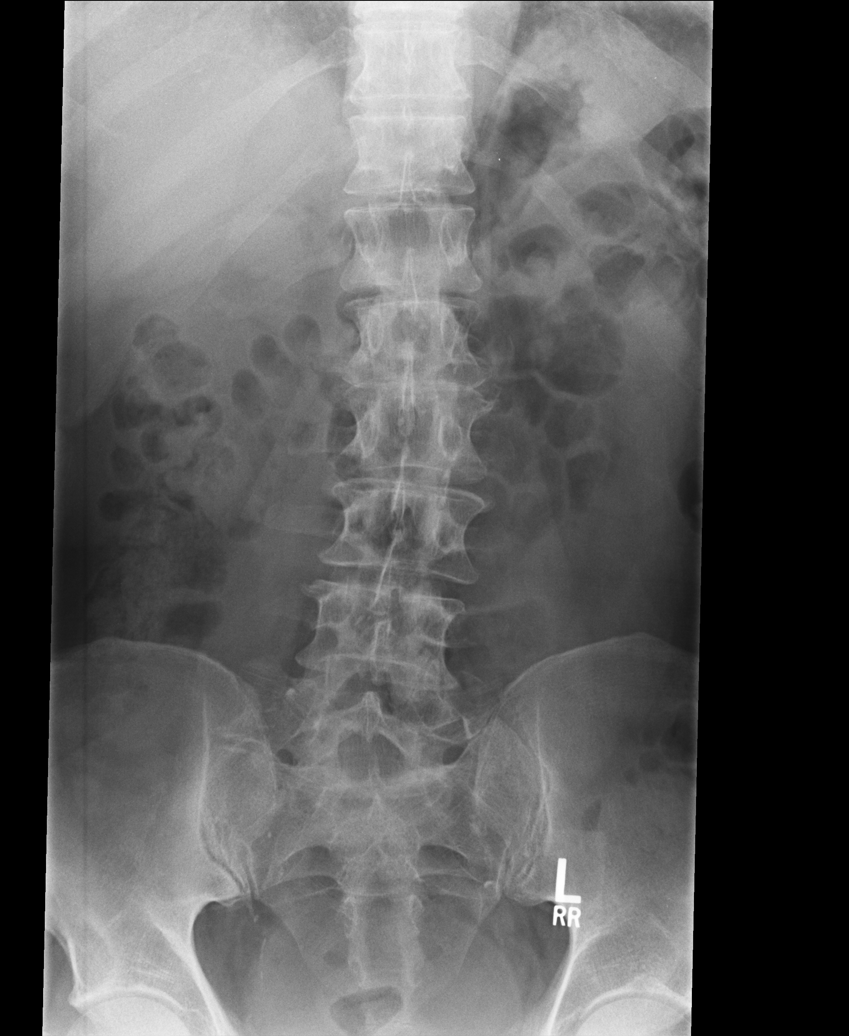

[rpo]
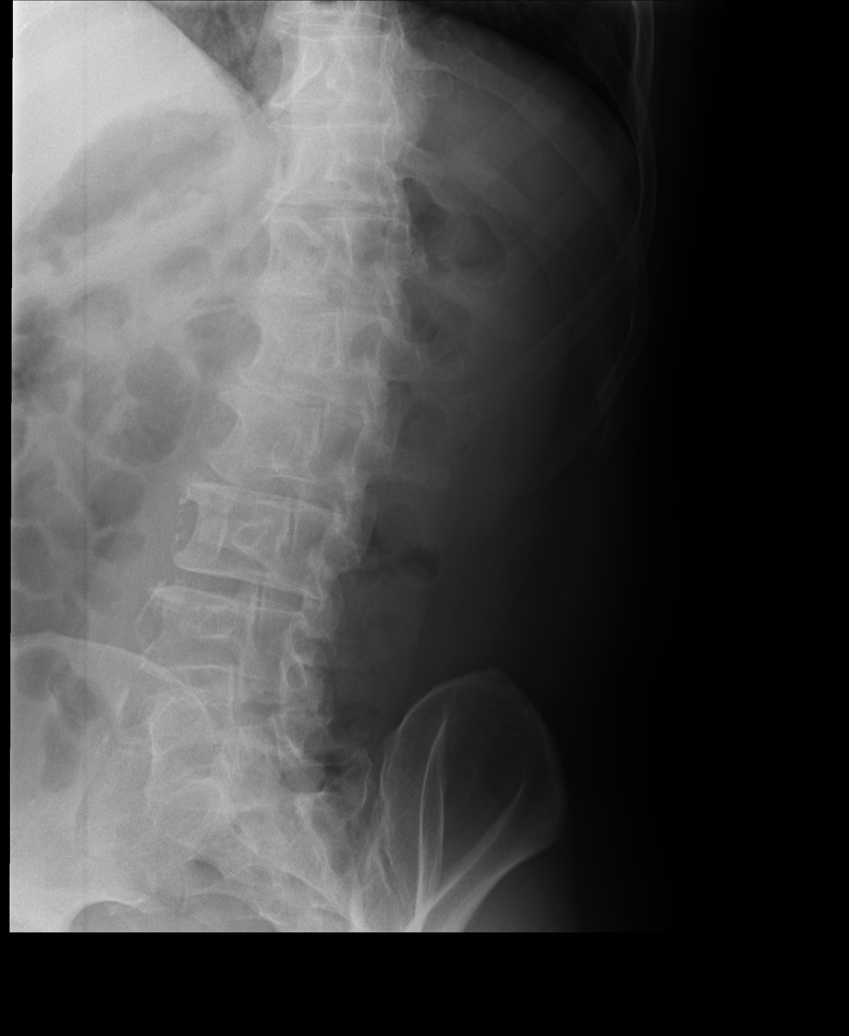

[lpo]
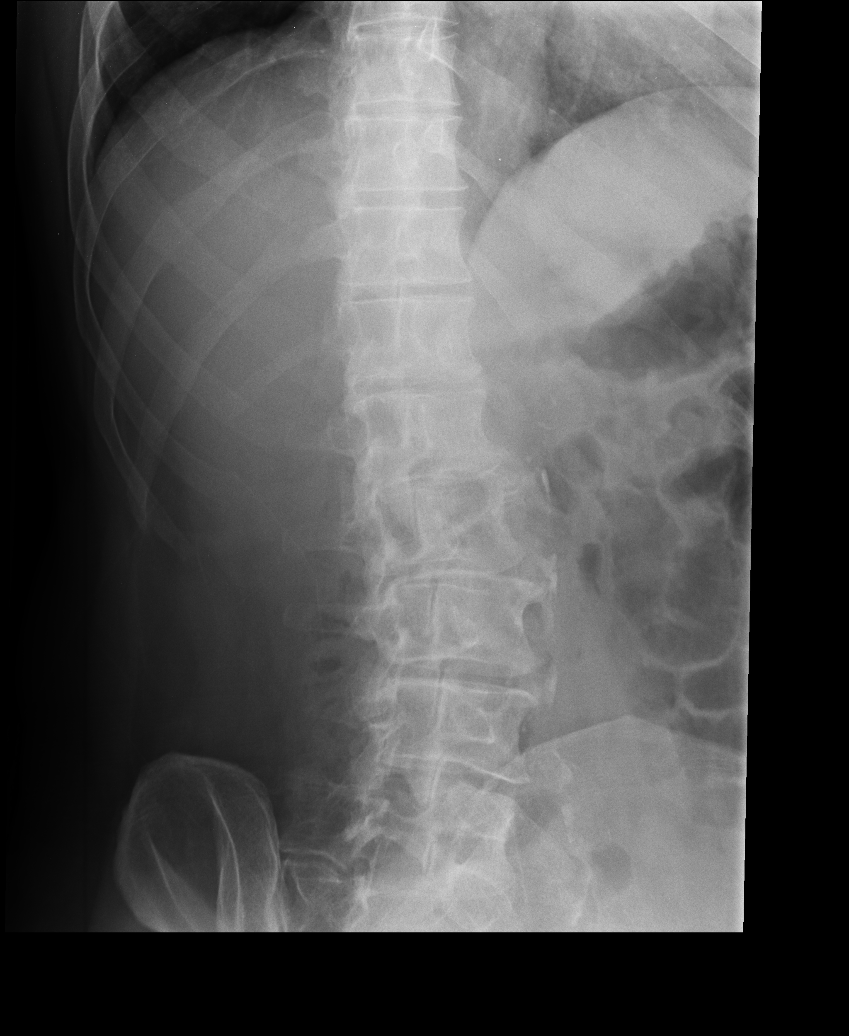

[lateral]
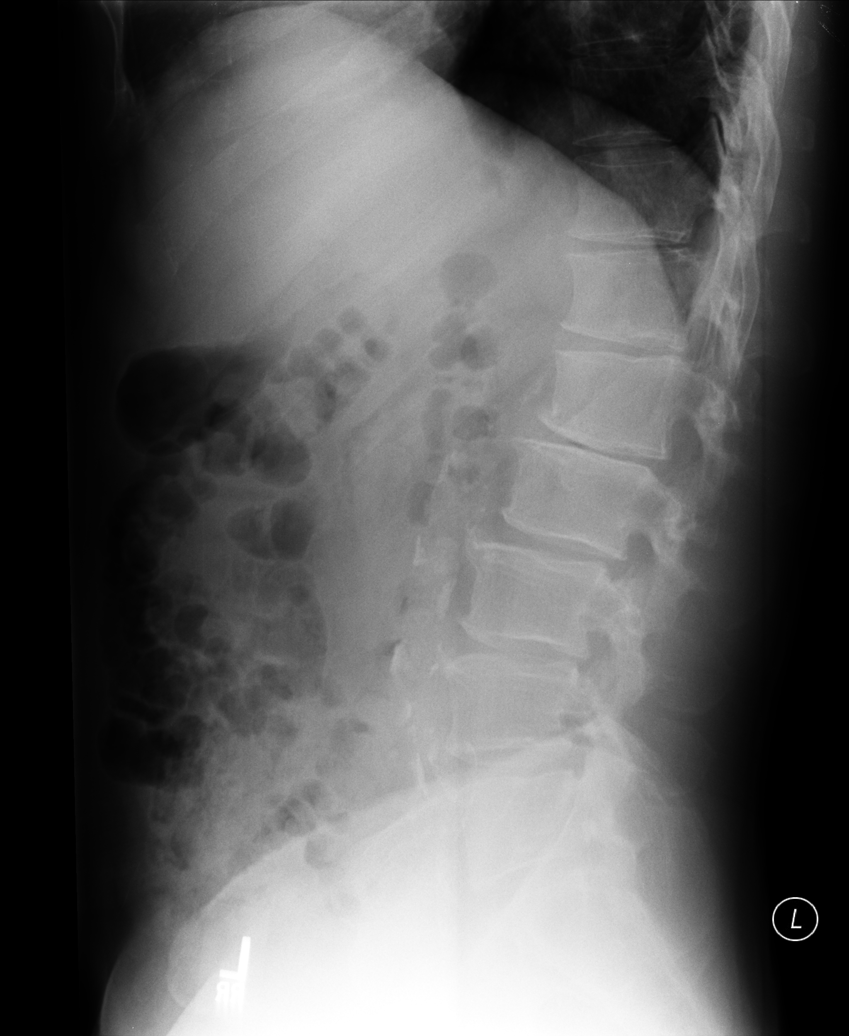

[l5 s1]
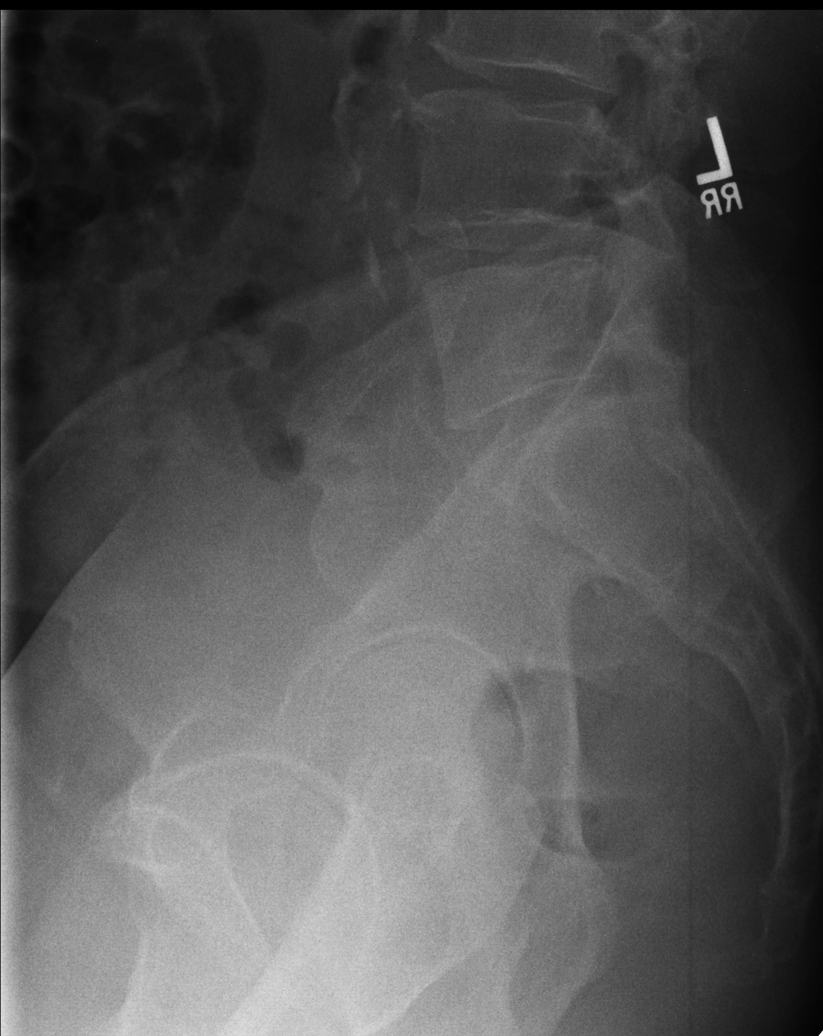

[5 of 5 positions shown; findings below may reference images not displayed]

FINDINGS: Paraspinal soft tissues are unremarkable. Diffuse degenerative
change lumbar spine. No acute bony abnormality . 4 mm
anterolisthesis L4 on L5. 5 mm retrolisthesis L1 on L2. These
changes are most likely degenerative. Aortoiliac atherosclerotic
vascular disease.
IMPRESSION: 1. Degenerative changes lumbar spine with 4 mm anterolisthesis of L4
on L5 and 5 mm retrolisthesis of L1 on L2. No acute bony
abnormality.
2. Aortoiliac atherosclerotic vascular disease

## 2018-09-17 ENCOUNTER — Other Ambulatory Visit: Payer: Self-pay | Admitting: Family Medicine

## 2018-09-17 DIAGNOSIS — F17200 Nicotine dependence, unspecified, uncomplicated: Secondary | ICD-10-CM

## 2018-10-16 ENCOUNTER — Ambulatory Visit
Admission: RE | Admit: 2018-10-16 | Discharge: 2018-10-16 | Disposition: A | Discharge status: Home / Self Care | Payer: Medicare Other | Source: Ambulatory Visit | Attending: Family Medicine | Admitting: Family Medicine

## 2018-10-16 DIAGNOSIS — F17200 Nicotine dependence, unspecified, uncomplicated: Secondary | ICD-10-CM

## 2019-10-13 ENCOUNTER — Other Ambulatory Visit: Payer: Self-pay | Admitting: Family Medicine

## 2019-10-13 DIAGNOSIS — F17200 Nicotine dependence, unspecified, uncomplicated: Secondary | ICD-10-CM

## 2019-10-27 ENCOUNTER — Ambulatory Visit
Admission: RE | Admit: 2019-10-27 | Discharge: 2019-10-27 | Disposition: A | Discharge status: Home / Self Care | Payer: Medicare (Managed Care) | Source: Ambulatory Visit | Attending: Family Medicine | Admitting: Family Medicine

## 2019-10-27 DIAGNOSIS — F17200 Nicotine dependence, unspecified, uncomplicated: Secondary | ICD-10-CM

## 2021-11-16 LAB — EXTERNAL GENERIC LAB PROCEDURE: COLOGUARD: NEGATIVE

## 2021-11-16 LAB — COLOGUARD: COLOGUARD: NEGATIVE

## 2021-12-28 ENCOUNTER — Other Ambulatory Visit: Payer: Self-pay | Admitting: Family Medicine

## 2021-12-28 DIAGNOSIS — Z720 Tobacco use: Secondary | ICD-10-CM

## 2022-02-10 ENCOUNTER — Inpatient Hospital Stay: Admission: RE | Admit: 2022-02-10 | Payer: Medicare Other | Source: Ambulatory Visit

## 2022-03-09 ENCOUNTER — Inpatient Hospital Stay: Admission: RE | Admit: 2022-03-09 | Payer: Medicare Other | Source: Ambulatory Visit

## 2022-04-04 ENCOUNTER — Other Ambulatory Visit: Payer: Medicare Other

## 2023-05-23 ENCOUNTER — Other Ambulatory Visit: Payer: Self-pay | Admitting: Family Medicine

## 2023-05-23 DIAGNOSIS — F17210 Nicotine dependence, cigarettes, uncomplicated: Secondary | ICD-10-CM

## 2023-06-12 ENCOUNTER — Ambulatory Visit
Admission: RE | Admit: 2023-06-12 | Discharge: 2023-06-12 | Disposition: A | Discharge status: Home / Self Care | Source: Ambulatory Visit | Attending: Family Medicine | Admitting: Family Medicine

## 2023-06-12 DIAGNOSIS — F17210 Nicotine dependence, cigarettes, uncomplicated: Secondary | ICD-10-CM

## 2023-07-10 ENCOUNTER — Other Ambulatory Visit: Payer: Self-pay | Admitting: Family Medicine

## 2023-07-10 DIAGNOSIS — I700 Atherosclerosis of aorta: Secondary | ICD-10-CM

## 2023-08-06 ENCOUNTER — Other Ambulatory Visit
